# Patient Record
Sex: Female | Born: 1950 | Race: White | Hispanic: No | Marital: Single | Smoking: Never smoker
Health system: Southern US, Community
[De-identification: ages and names within clinical notes are randomized; demographics above are authoritative.]

## PROBLEM LIST (undated history)

## (undated) HISTORY — PX: ELBOW SURGERY: SHX618

## (undated) HISTORY — PX: HIP SURGERY: SHX245

---

## 1999-06-14 ENCOUNTER — Other Ambulatory Visit: Admission: RE | Admit: 1999-06-14 | Discharge: 1999-06-14 | Payer: Self-pay | Admitting: Obstetrics & Gynecology

## 2002-11-25 ENCOUNTER — Other Ambulatory Visit: Admission: RE | Admit: 2002-11-25 | Discharge: 2002-11-25 | Payer: Self-pay | Admitting: Obstetrics & Gynecology

## 2003-12-16 ENCOUNTER — Other Ambulatory Visit: Admission: RE | Admit: 2003-12-16 | Discharge: 2003-12-16 | Payer: Self-pay | Admitting: Obstetrics & Gynecology

## 2004-12-28 ENCOUNTER — Other Ambulatory Visit: Admission: RE | Admit: 2004-12-28 | Discharge: 2004-12-28 | Payer: Self-pay | Admitting: Obstetrics & Gynecology

## 2005-07-25 ENCOUNTER — Ambulatory Visit (HOSPITAL_COMMUNITY): Admission: RE | Admit: 2005-07-25 | Discharge: 2005-07-25 | Payer: Self-pay | Admitting: Gastroenterology

## 2005-10-24 ENCOUNTER — Emergency Department (HOSPITAL_COMMUNITY): Admission: EM | Admit: 2005-10-24 | Discharge: 2005-10-24 | Payer: Self-pay | Admitting: Emergency Medicine

## 2005-11-03 ENCOUNTER — Ambulatory Visit (HOSPITAL_BASED_OUTPATIENT_CLINIC_OR_DEPARTMENT_OTHER): Admission: RE | Admit: 2005-11-03 | Discharge: 2005-11-03 | Payer: Self-pay | Admitting: Orthopedic Surgery

## 2005-11-14 ENCOUNTER — Encounter: Admission: RE | Admit: 2005-11-14 | Discharge: 2006-02-12 | Payer: Self-pay | Admitting: Orthopedic Surgery

## 2007-02-16 ENCOUNTER — Encounter: Admission: RE | Admit: 2007-02-16 | Discharge: 2007-02-16 | Payer: Self-pay | Admitting: Obstetrics & Gynecology

## 2009-08-04 ENCOUNTER — Emergency Department (HOSPITAL_COMMUNITY): Admission: EM | Admit: 2009-08-04 | Discharge: 2009-08-04 | Payer: Self-pay | Admitting: Family Medicine

## 2010-05-27 ENCOUNTER — Ambulatory Visit: Payer: Self-pay | Admitting: Sports Medicine

## 2010-05-27 DIAGNOSIS — M79609 Pain in unspecified limb: Secondary | ICD-10-CM | POA: Insufficient documentation

## 2010-09-07 NOTE — Assessment & Plan Note (Signed)
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  Allergies (verified): No Known Drug Allergies  Physical Exam  General:  Well-developed,well-nourished,in no acute distress; alert,appropriate and cooperative throughout examination Msk:  Really not tender over PF insertion on RT has more tenderness over outer heel near plantar surface good ROM of toes mod high arch  left PF is nontender Additional Exam:  MSK [place] There is evidence of calcificaiton in PF insertion on RT some slt change of same on left no abnormal thickening of either PF though ON RT at lat margin of calcaneus there is a small bone spur with edema surrounding this   Impression & Recommendations:  Problem # 1:  HEEL PAIN (ICD-729.5)  Orders: Heel Cup (K4401) Sports Insoles (L3510) [place] LIMITED (02725)  RT side shows some spurring off lat calcaneus PF is not impressive  needs better shoe inserts we may want to get into custom orthotics w her walking stress  trial on sports insoles and heel cups  give std PF exrecises to prevent recurrence as I think she has had this in past based on scan  reck 1 mo   Orders Added: 1)  Heel Cup [L3649] 2)  Sports Insoles [L3510] 3)  New Patient Level II [99202] 4)  [place] LIMITED [36644]

## 2010-12-24 NOTE — Op Note (Signed)
NAMEHYACINTH, Kayla Cole               ACCOUNT NO.:  [id number]   MEDICAL RECORD NO.:  [id number]          PATIENT TYPE:  AMB   LOCATION:  ENDO                         FACILITY:  [facility]   PHYSICIAN:  Anselmo Rod, M.D.  DATE OF BIRTH:  09-14-1950   DATE OF PROCEDURE:  07/25/2005  DATE OF DISCHARGE:                                 OPERATIVE REPORT   PROCEDURE PERFORMED:  Screening colonoscopy.   ENDOSCOPIST:  Anselmo Rod, M.D.   INSTRUMENT USED:  Olympus video colonoscope.   INDICATIONS FOR PROCEDURE:  This is a 59 year old white female with a family  history of colon cancer in her maternal.  She is undergoing screening  colonoscopy to rule out colonic polyps, masses, etc.   PREPROCEDURE PREPARATION:  Informed consent was procured from the patient.  The patient fasted for four hour prior to the procedure and prepped with  OsmoPrep pills the night and in the morning of the procedure.  The risks and  benefits of the procedure including a 10% missed rate of cancer involved  were discussed with the patient as well.   PREPROCEDURE PHYSICAL EXAMINATION:  VITAL SIGNS:  The patient has stable  vital signs.  NECK:  The neck is supple.  CHEST:  The chest is clear to auscultation.  HEART:  Normal S1 and S2.  ABDOMEN:  The abdomen is soft with normal bowel sounds.   DESCRIPTION OF THE PROCEDURE:  The patient was placed in the left lateral  decubitus position.  She was sedated with a hundred micrograms of fentanyl  and 10 mg of Versed in slow incremental doses.  Once the patient was  adequately sedated, maintained on low flow oxygen and continuous cardiac  monitoring the Olympus video colonoscope was advanced in the rectum to the  cecum. The appendiceal orifice and ileocecal valve were clearly visualized  and photographed.  There were a few scattered  diverticula noted.  The  terminal ileum appeared healthy and without lesions.  Retroflexion in the  rectum revealed small nonbleeding  internal hemorrhoids. The patient  tolerated the procedure well without complications.   IMPRESSION:  1.Normal colonoscopy to the terminal ileum, except for a few  scattered diverticula.  2.Small internal hemorrhoids.   RECOMMENDATIONS:  1.Continue a high fiber diet with liberal fluid intake.  2.Repeat colonoscopy in the next five years unless the patient develops an  abnormal symptoms in the interim in which case she should contact the office  immediately.  3.Outpatient follow up as need arises in the future.      Anselmo Rod, M.D.  Electronically Signed     JNM/MEDQ  D:  07/25/2005  T:  07/27/2005  Job:  [id number]   cc:   Ilda Mori, M.D.  Fax: [phone]

## 2010-12-24 NOTE — Op Note (Signed)
NAMESHARLIZE, HOAR               ACCOUNT NO.:  [id number]   MEDICAL RECORD NO.:  [id number]          PATIENT TYPE:  AMB   LOCATION:  DSC                          FACILITY:  [facility]   PHYSICIAN:  Rodney A. Mortenson, M.D.DATE OF BIRTH:  [birth year]   DATE OF PROCEDURE:  11/03/2005  DATE OF DISCHARGE:                                 OPERATIVE REPORT   PREOPERATIVE DIAGNOSIS:  Comminuted fracture right olecranon.   POSTOPERATIVE DIAGNOSIS:  Comminuted fracture right olecranon.   OPERATION:  Open reduction and internal fixation using a nine-hole Acumed  Congruent olecranon plate, right elbow.   SURGEON:  Mortenson.   ASSISTANT:  Whitfield.   ANESTHESIA:  General.   PROCEDURE:  The patient was placed on the operating table in the supine  position.  After general anesthesia the right upper extremity was prepped  with DuraPrep and draped out in the usual manner.  A sterile tourniquet was  then placed on the arm.  The arm was wrapped out with an Esmarch and  tourniquet was elevated.  An incision was made over the posterior olecranon  and carried proximally down the ulna.  Skin edges were retracted.  Care was  taken to avoid any injury to the superficial cutaneous nerves.  Dissection  was carried down to the olecranon itself.  The fracture was clearly seen and  the fracture was markedly displaced.  Soft tissue was stripped off either  side of the proximal olecranon and good visualization was achieved.  With  the fracture site opened, there were loose pieces of bone in the elbow joint  and these were removed.  Some of the small pieces of bone had articular  cartilage and these were also removed.  The elbow was irrigated with saline  solution.  All loose material was removed.  With manipulation, the fracture  was brought back in a fairly anatomic position and held in position with  towel clip.  Crossed K wires were then applied and x-rays using a C-arm was  used throughout.  An excellent  reduction was achieved.  The Acumed nine-hold  olecranon plate was then selected and placed over the olecranon and distally  onto the ulna.  Drill hole was placed posteriorly and a long 50-mm screw was  placed through the olecranon and down the canal of the ulna.  As the  fracture was compressed, drill holes were then placed in the plate distally  beyond the fracture site.  A fair amount of time was spent doing this part  of the procedure.  An excellent very stable construct was obtained.  By  radiograph criteria, the curvature of the olecranon and the articular  surfaces appeared almost anatomic.  There was a defect, however, at the  fracture site with loss of some articular cartilage.  With flexion and  extension, there was no crepitus or other abnormality felt about the elbow.  There was a large fragment of bone on the lateral side of the olecranon that  was held back in anatomic position with a small screw placed through this  fragment and closed the comminuted fracture laterally.  Again the elbow was  irrigated with a copious amount of saline solution as the skin was flaky and  dry.  An excellent construction of the fracture was achieved.  I was very  pleased with this.  A 2-0 Vicryl was then used to close the fascial layers  and the subcutaneous tissue and stainless steel staples were used to close  the skin.  Marcaine was placed about the wound.  Bulky sterile dressing was  applied and a posterior plaster splint with the elbow flexed at about 80  degrees was done.  __________ procedure went extremely well.   DRAINS:  None.   COMPLICATIONS:  None.           ______________________________  Lenard Galloway. Chaney Malling, M.D.     RAM/MEDQ  D:  11/03/2005  T:  11/04/2005  Job:  [id number]

## 2012-05-29 ENCOUNTER — Ambulatory Visit (INDEPENDENT_AMBULATORY_CARE_PROVIDER_SITE_OTHER): Payer: Self-pay | Admitting: Family Medicine

## 2012-05-29 DIAGNOSIS — Z713 Dietary counseling and surveillance: Secondary | ICD-10-CM

## 2015-04-17 ENCOUNTER — Emergency Department (HOSPITAL_COMMUNITY)
Admission: EM | Admit: 2015-04-17 | Discharge: 2015-04-17 | Disposition: A | Discharge status: Home / Self Care | Payer: 59 | Source: Home / Self Care | Attending: Family Medicine | Admitting: Family Medicine

## 2015-04-17 ENCOUNTER — Encounter (HOSPITAL_COMMUNITY): Payer: Self-pay | Admitting: *Deleted

## 2015-04-17 DIAGNOSIS — B029 Zoster without complications: Secondary | ICD-10-CM

## 2015-04-17 MED ORDER — VALACYCLOVIR HCL 1 G PO TABS: 1000.0000 mg | ORAL_TABLET | Freq: Three times a day (TID) | ORAL | Status: AC

## 2015-04-17 MED ORDER — HYDROCODONE-ACETAMINOPHEN 5-325 MG PO TABS: 1.0000 | ORAL_TABLET | ORAL | Status: AC | PRN

## 2015-04-17 MED ORDER — LIDOCAINE 5 % EX PTCH: 1.0000 | MEDICATED_PATCH | CUTANEOUS | Status: AC

## 2015-04-17 NOTE — ED Provider Notes (Signed)
CSN: [id number]     Arrival date & time 04/17/15  1719 History   First MD Initiated Contact with Patient 04/17/15 1828     Chief Complaint  Patient presents with  . Rash   (Consider location/radiation/quality/duration/timing/severity/associated sxs/prior Treatment) HPI Comments: 64 year old female complaining of painful rash that began approximately one week ago. He developed as a couple of small tender papules that over the past 3 days it has increased in size substantially as well has the associated pain described as burning, stinging and aching. It is located along the left hip from the mid clavicular line to the right mid scapular line. There is another line of erythema and vesicles beginning at the anterior axillary line to the spine. The rashes are tender. Hurts with movement. Denies fever, chills or other systemic symptoms. In addition she was having low back pain and left hip pain a couple weeks ago and saw an orthopedist who treated her for trochanteric bursitis with an injection of steroids as well as by mouth steroids.   History reviewed. No pertinent past medical history. Past Surgical History  Procedure Laterality Date  . Elbow surgery    . Hip surgery     History reviewed. No pertinent family history. Social History  Substance Use Topics  . Smoking status: Never Smoker   . Smokeless tobacco: None  . Alcohol Use: Yes   OB History    No data available     Review of Systems  Constitutional: Positive for activity change. Negative for fever and fatigue.  HENT: Negative.   Respiratory: Negative.   Gastrointestinal: Negative.   Genitourinary: Negative.   Musculoskeletal: Positive for back pain and gait problem.  Skin: Positive for color change and rash.  Neurological: Negative for dizziness and headaches.  Psychiatric/Behavioral: Negative.     Allergies  Review of patient's allergies indicates no known allergies.  Home Medications   Prior to Admission medications    Medication Sig Start Date End Date Taking? Authorizing Provider  Methocarbamol (ROBAXIN PO) Take by mouth.   Yes Historical Provider, MD  HYDROcodone-acetaminophen (NORCO/VICODIN) 5-325 MG per tablet Take 1 tablet by mouth every 4 (four) hours as needed. 04/17/15   Janne Napoleon, NP  lidocaine (LIDODERM) 5 % Place 1 patch onto the skin daily. Remove & Discard patch within 12 hours or as directed by MD 04/17/15   Janne Napoleon, NP  valACYclovir (VALTREX) 1000 MG tablet Take 1 tablet (1,000 mg total) by mouth 3 (three) times daily. 04/17/15   Janne Napoleon, NP   Meds Ordered and Administered this Visit  Medications - No data to display  BP 148/97 mmHg  Pulse 84  Temp(Src) 98.3 F (36.8 C) (Oral)  Resp 16  SpO2 98% No data found.   Physical Exam  Constitutional: She is oriented to person, place, and time. She appears well-developed and well-nourished. No distress.  HENT:  Face and eyes are spared from the zoster lesions  Eyes: EOM are normal.  Neck: Normal range of motion. Neck supple.  Cardiovascular: Normal rate.   Pulmonary/Chest: Effort normal. No respiratory distress.  Musculoskeletal: Normal range of motion. She exhibits tenderness. She exhibits no edema.  Tender L hip  Neurological: She is alert and oriented to person, place, and time. She exhibits normal muscle tone.  Skin: Skin is warm and dry. Rash noted.  Dermatomes T8, T10 and T11 to be affected with a papulovesicular rash with underlying deep erythema. Marked tenderness. No sign of bacterial infection.  Psychiatric: She has a  normal mood and affect.  Nursing note and vitals reviewed.   ED Course  Procedures (including critical care time)  Labs Review Labs Reviewed - No data to display  Imaging Review No results found.   Visual Acuity Review  Right Eye Distance:   Left Eye Distance:   Bilateral Distance:    Right Eye Near:   Left Eye Near:    Bilateral Near:         MDM   1. Herpes zoster    Valtrex 1 gm  tid norco 5 mg #15 lidoderm patches as dir    Janne Napoleon, NP 04/17/15 [phone]

## 2015-04-17 NOTE — Discharge Instructions (Signed)
Shingles Shingles (herpes zoster) is an infection that is caused by the same virus that causes chickenpox (varicella). The infection causes a painful skin rash and fluid-filled blisters, which eventually break open, crust over, and heal. It may occur in any area of the body, but it usually affects only one side of the body or face. The pain of shingles usually lasts about 1 month. However, some people with shingles may develop long-term (chronic) pain in the affected area of the body. Shingles often occurs many years after the person had chickenpox. It is more common:  In people older than 50 years.  In people with weakened immune systems, such as those with HIV, AIDS, or cancer.  In people taking medicines that weaken the immune system, such as transplant medicines.  In people under great stress. CAUSES  Shingles is caused by the varicella zoster virus (VZV), which also causes chickenpox. After a person is infected with the virus, it can remain in the person's body for years in an inactive state (dormant). To cause shingles, the virus reactivates and breaks out as an infection in a nerve root. The virus can be spread from person to person (contagious) through contact with open blisters of the shingles rash. It will only spread to people who have not had chickenpox. When these people are exposed to the virus, they may develop chickenpox. They will not develop shingles. Once the blisters scab over, the person is no longer contagious and cannot spread the virus to others. SIGNS AND SYMPTOMS  Shingles shows up in stages. The initial symptoms may be pain, itching, and tingling in an area of the skin. This pain is usually described as burning, stabbing, or throbbing.In a few days or weeks, a painful red rash will appear in the area where the pain, itching, and tingling were felt. The rash is usually on one side of the body in a band or belt-like pattern. Then, the rash usually turns into fluid-filled  blisters. They will scab over and dry up in approximately 2-3 weeks. Flu-like symptoms may also occur with the initial symptoms, the rash, or the blisters. These may include:  Fever.  Chills.  Headache.  Upset stomach. DIAGNOSIS  Your health care provider will perform a skin exam to diagnose shingles. Skin scrapings or fluid samples may also be taken from the blisters. This sample will be examined under a microscope or sent to a lab for further testing. TREATMENT  There is no specific cure for shingles. Your health care provider will likely prescribe medicines to help you manage the pain, recover faster, and avoid long-term problems. This may include antiviral drugs, anti-inflammatory drugs, and pain medicines. HOME CARE INSTRUCTIONS   Take a cool bath or apply cool compresses to the area of the rash or blisters as directed. This may help with the pain and itching.   Take medicines only as directed by your health care provider.   Rest as directed by your health care provider.  Keep your rash and blisters clean with mild soap and cool water or as directed by your health care provider.  Do not pick your blisters or scratch your rash. Apply an anti-itch cream or numbing creams to the affected area as directed by your health care provider.  Keep your shingles rash covered with a loose bandage (dressing).  Avoid skin contact with:  Babies.   Pregnant women.   Children with eczema.   Elderly people with transplants.   People with chronic illnesses, such as leukemia  or AIDS.   °· Wear loose-fitting clothing to help ease the pain of material rubbing against the rash. °· Keep all follow-up visits as directed by your health care provider. If the area involved is on your face, you may receive a referral for a specialist, such as an eye doctor (ophthalmologist) or an ear, nose, and throat (ENT) doctor. Keeping all follow-up visits will help you avoid eye problems, chronic pain, or  disability.   °SEEK IMMEDIATE MEDICAL CARE IF:  °· You have facial pain, pain around the eye area, or loss of feeling on one side of your face. °· You have ear pain or ringing in your ear. °· You have loss of taste. °· Your pain is not relieved with prescribed medicines.   °· Your redness or swelling spreads.   °· You have more pain and swelling.  °· Your condition is worsening or has changed.   °· You have a fever. °MAKE SURE YOU: °· Understand these instructions. °· Will watch your condition. °· Will get help right away if you are not doing well or get worse. °Document Released: 07/25/2005 Document Revised: 12/09/2013 Document Reviewed: 03/08/2012 °ExitCare® Patient Information ©2015 [employer]. This information is not intended to replace advice given to you by your health care provider. Make sure you discuss any questions you have with your health care provider. ° °Neuropathic Pain °We often think that pain has a physical cause. If we get rid of the cause, the pain should go away. Nerves themselves can also cause pain. It is called neuropathic pain, which means nerve abnormality. It may be difficult for the patients who have it and for the treating caregivers. Pain is usually described as acute (short-lived) or chronic (long-lasting). Acute pain is related to the physical sensations caused by an injury. It can last from a few seconds to many weeks, but it usually goes away when normal healing occurs. Chronic pain lasts beyond the typical healing time. With neuropathic pain, the nerve fibers themselves may be damaged or injured. They then send incorrect signals to other pain centers. The pain you feel is real, but the cause is not easy to find.  °CAUSES  °Chronic pain can result from diseases, such as diabetes and shingles (an infection related to chickenpox), or from trauma, surgery, or amputation. It can also happen without any known injury or disease. The nerves are sending pain messages, even though there  is no identifiable cause for such messages.  °· Other common causes of neuropathy include diabetes, phantom limb pain, or Regional Pain Syndrome (RPS). °· As with all forms of chronic back pain, if neuropathy is not correctly treated, there can be a number of associated problems that lead to a downward cycle for the patient. These include depression, sleeplessness, feelings of fear and anxiety, limited social interaction and inability to do normal daily activities or work. °· The most dramatic and mysterious example of neuropathic pain is called "phantom limb syndrome." This occurs when an arm or a leg has been removed because of illness or injury. The brain still gets pain messages from the nerves that originally carried impulses from the missing limb. These nerves now seem to misfire and cause troubling pain. °· Neuropathic pain often seems to have no cause. It responds poorly to standard pain treatment. °Neuropathic pain can occur after: °· Shingles (herpes zoster virus infection). °· A lasting burning sensation of the skin, caused usually by injury to a peripheral nerve. °· Peripheral neuropathy which is widespread nerve damage, often caused by diabetes or   alcoholism. °· Phantom limb pain following an amputation. °· Facial nerve problems (trigeminal neuralgia). °· Multiple sclerosis. °· Reflex sympathetic dystrophy. °· Pain which comes with cancer and cancer chemotherapy. °· Entrapment neuropathy such as when pressure is put on a nerve such as in carpal tunnel syndrome. °· Back, leg, and hip problems (sciatica). °· Spine or back surgery. °· HIV Infection or AIDS where nerves are infected by viruses. °Your caregiver can explain items in the above list which may apply to you. °SYMPTOMS  °Characteristics of neuropathic pain are: °· Severe, sharp, electric shock-like, shooting, lightening-like, knife-like. °· Pins and needles sensation. °· Deep burning, deep cold, or deep ache. °· Persistent numbness, tingling, or  weakness. °· Pain resulting from light touch or other stimulus that would not usually cause pain. °· Increased sensitivity to something that would normally cause pain, such as a pinprick. °Pain may persist for months or years following the healing of damaged tissues. When this happens, pain signals no longer sound an alarm about current injuries or injuries about to happen. Instead, the alarm system itself is not working correctly.  °Neuropathic pain may get worse instead of better over time. For some people, it can lead to serious disability. It is important to be aware that severe injury in a limb can occur without a proper, protective pain response. Burns, cuts, and other injuries may go unnoticed. Without proper treatment, these injuries can become infected or lead to further disability. Take any injury seriously, and consult your caregiver for treatment. °DIAGNOSIS  °When you have a pain with no known cause, your caregiver will probably ask some specific questions:  °· Do you have any other conditions, such as diabetes, shingles, multiple sclerosis, or HIV infection? °· How would you describe your pain? (Neuropathic pain is often described as shooting, stabbing, burning, or searing.) °· Is your pain worse at any time of the day? (Neuropathic pain is usually worse at night.) °· Does the pain seem to follow a certain physical pathway? °· Does the pain come from an area that has missing or injured nerves? (An example would be phantom limb pain.) °· Is the pain triggered by minor things such as rubbing against the sheets at night? °These questions often help define the type of pain involved. Once your caregiver knows what is happening, treatment can begin. Anticonvulsant, antidepressant drugs, and various pain relievers seem to work in some cases. If another condition, such as diabetes is involved, better management of that disorder may relieve the neuropathic pain.  °TREATMENT  °Neuropathic pain is frequently  long-lasting and tends not to respond to treatment with narcotic type pain medication. It may respond well to other drugs such as antiseizure and antidepressant medications. Usually, neuropathic problems do not completely go away, but partial improvement is often possible with proper treatment. Your caregivers have large numbers of medications available to treat you. Do not be discouraged if you do not get immediate relief. Sometimes different medications or a combination of medications will be tried before you receive the results you are hoping for. See your caregiver if you have pain that seems to be coming from nowhere and does not go away. Help is available.  °SEEK IMMEDIATE MEDICAL CARE IF:  °· There is a sudden change in the quality of your pain, especially if the change is on only one side of the body. °· You notice changes of the skin, such as redness, black or purple discoloration, swelling, or an ulcer. °· You cannot move the affected   limbs. °Document Released: 04/21/2004 Document Revised: 10/17/2011 Document Reviewed: 04/21/2004 °ExitCare® Patient Information ©2015 [employer]. This information is not intended to replace advice given to you by your health care provider. Make sure you discuss any questions you have with your health care provider. ° °

## 2015-04-17 NOTE — ED Notes (Signed)
Pt  Has  A  Rash  On  The  l   Hip   Area       X  3-4  Days  -      Seen  For     bursistis   6     Days      Ago          And  Was  RX    Prednisone  /  Robaxin          The  reash  Burns    And    radaites  Around  flank

## 2016-04-28 DIAGNOSIS — H524 Presbyopia: Secondary | ICD-10-CM | POA: Diagnosis not present

## 2016-04-28 DIAGNOSIS — H04123 Dry eye syndrome of bilateral lacrimal glands: Secondary | ICD-10-CM | POA: Diagnosis not present

## 2016-11-23 DIAGNOSIS — Z01419 Encounter for gynecological examination (general) (routine) without abnormal findings: Secondary | ICD-10-CM | POA: Diagnosis not present

## 2016-11-23 DIAGNOSIS — Z1231 Encounter for screening mammogram for malignant neoplasm of breast: Secondary | ICD-10-CM | POA: Diagnosis not present

## 2016-11-25 MED FILL — PHENTERMINE 37.5 MG TABLET: 37.5 | 30 days supply | Qty: 30 | Fill #0

## 2017-05-01 DIAGNOSIS — H04123 Dry eye syndrome of bilateral lacrimal glands: Secondary | ICD-10-CM | POA: Diagnosis not present

## 2017-05-01 DIAGNOSIS — H524 Presbyopia: Secondary | ICD-10-CM | POA: Diagnosis not present

## 2018-03-06 DIAGNOSIS — R69 Illness, unspecified: Secondary | ICD-10-CM | POA: Diagnosis not present

## 2018-06-11 DIAGNOSIS — R69 Illness, unspecified: Secondary | ICD-10-CM | POA: Diagnosis not present

## 2018-10-02 DIAGNOSIS — R69 Illness, unspecified: Secondary | ICD-10-CM | POA: Diagnosis not present

## 2019-04-04 DIAGNOSIS — R69 Illness, unspecified: Secondary | ICD-10-CM | POA: Diagnosis not present

## 2019-04-22 DIAGNOSIS — H04123 Dry eye syndrome of bilateral lacrimal glands: Secondary | ICD-10-CM | POA: Diagnosis not present

## 2019-04-22 DIAGNOSIS — H43812 Vitreous degeneration, left eye: Secondary | ICD-10-CM | POA: Diagnosis not present

## 2019-04-22 DIAGNOSIS — H353121 Nonexudative age-related macular degeneration, left eye, early dry stage: Secondary | ICD-10-CM | POA: Diagnosis not present

## 2019-05-15 DIAGNOSIS — Z131 Encounter for screening for diabetes mellitus: Secondary | ICD-10-CM | POA: Diagnosis not present

## 2019-05-15 DIAGNOSIS — Z86010 Personal history of colonic polyps: Secondary | ICD-10-CM | POA: Diagnosis not present

## 2019-05-15 DIAGNOSIS — Z0000 Encounter for general adult medical examination without abnormal findings: Secondary | ICD-10-CM | POA: Diagnosis not present

## 2019-05-15 DIAGNOSIS — E663 Overweight: Secondary | ICD-10-CM | POA: Diagnosis not present

## 2019-05-15 DIAGNOSIS — Z13220 Encounter for screening for lipoid disorders: Secondary | ICD-10-CM | POA: Diagnosis not present

## 2019-05-30 DIAGNOSIS — H43812 Vitreous degeneration, left eye: Secondary | ICD-10-CM | POA: Diagnosis not present

## 2019-05-30 DIAGNOSIS — H353121 Nonexudative age-related macular degeneration, left eye, early dry stage: Secondary | ICD-10-CM | POA: Diagnosis not present

## 2019-05-30 DIAGNOSIS — H04123 Dry eye syndrome of bilateral lacrimal glands: Secondary | ICD-10-CM | POA: Diagnosis not present

## 2019-05-31 DIAGNOSIS — H43813 Vitreous degeneration, bilateral: Secondary | ICD-10-CM | POA: Diagnosis not present

## 2019-05-31 DIAGNOSIS — H35362 Drusen (degenerative) of macula, left eye: Secondary | ICD-10-CM | POA: Diagnosis not present

## 2019-05-31 DIAGNOSIS — H35033 Hypertensive retinopathy, bilateral: Secondary | ICD-10-CM | POA: Diagnosis not present

## 2019-05-31 DIAGNOSIS — H31091 Other chorioretinal scars, right eye: Secondary | ICD-10-CM | POA: Diagnosis not present

## 2019-06-12 DIAGNOSIS — R69 Illness, unspecified: Secondary | ICD-10-CM | POA: Diagnosis not present

## 2019-06-21 DIAGNOSIS — Z0000 Encounter for general adult medical examination without abnormal findings: Secondary | ICD-10-CM | POA: Diagnosis not present

## 2019-06-21 DIAGNOSIS — Z86010 Personal history of colonic polyps: Secondary | ICD-10-CM | POA: Diagnosis not present

## 2019-06-21 DIAGNOSIS — Z131 Encounter for screening for diabetes mellitus: Secondary | ICD-10-CM | POA: Diagnosis not present

## 2019-06-21 DIAGNOSIS — Z13220 Encounter for screening for lipoid disorders: Secondary | ICD-10-CM | POA: Diagnosis not present

## 2019-06-21 DIAGNOSIS — E663 Overweight: Secondary | ICD-10-CM | POA: Diagnosis not present

## 2019-07-15 DIAGNOSIS — H31091 Other chorioretinal scars, right eye: Secondary | ICD-10-CM | POA: Diagnosis not present

## 2019-07-15 DIAGNOSIS — H35033 Hypertensive retinopathy, bilateral: Secondary | ICD-10-CM | POA: Diagnosis not present

## 2019-07-15 DIAGNOSIS — H35362 Drusen (degenerative) of macula, left eye: Secondary | ICD-10-CM | POA: Diagnosis not present

## 2019-07-15 DIAGNOSIS — H43813 Vitreous degeneration, bilateral: Secondary | ICD-10-CM | POA: Diagnosis not present

## 2019-09-23 DIAGNOSIS — H43813 Vitreous degeneration, bilateral: Secondary | ICD-10-CM | POA: Diagnosis not present

## 2019-09-23 DIAGNOSIS — H35033 Hypertensive retinopathy, bilateral: Secondary | ICD-10-CM | POA: Diagnosis not present

## 2019-09-23 DIAGNOSIS — H35362 Drusen (degenerative) of macula, left eye: Secondary | ICD-10-CM | POA: Diagnosis not present

## 2019-09-23 DIAGNOSIS — H31091 Other chorioretinal scars, right eye: Secondary | ICD-10-CM | POA: Diagnosis not present

## 2019-10-21 DIAGNOSIS — R69 Illness, unspecified: Secondary | ICD-10-CM | POA: Diagnosis not present

## 2019-12-10 DIAGNOSIS — R69 Illness, unspecified: Secondary | ICD-10-CM | POA: Diagnosis not present

## 2019-12-31 DIAGNOSIS — Z6824 Body mass index (BMI) 24.0-24.9, adult: Secondary | ICD-10-CM | POA: Diagnosis not present

## 2019-12-31 DIAGNOSIS — Z1231 Encounter for screening mammogram for malignant neoplasm of breast: Secondary | ICD-10-CM | POA: Diagnosis not present

## 2019-12-31 DIAGNOSIS — Z01419 Encounter for gynecological examination (general) (routine) without abnormal findings: Secondary | ICD-10-CM | POA: Diagnosis not present

## 2020-01-02 ENCOUNTER — Other Ambulatory Visit: Payer: Self-pay | Admitting: Obstetrics & Gynecology

## 2020-01-02 DIAGNOSIS — R928 Other abnormal and inconclusive findings on diagnostic imaging of breast: Secondary | ICD-10-CM

## 2020-01-14 ENCOUNTER — Other Ambulatory Visit: Payer: Self-pay

## 2020-01-14 ENCOUNTER — Ambulatory Visit
Admission: RE | Admit: 2020-01-14 | Discharge: 2020-01-14 | Disposition: A | Discharge status: Home / Self Care | Payer: Medicare HMO | Source: Ambulatory Visit | Attending: Obstetrics & Gynecology | Admitting: Obstetrics & Gynecology

## 2020-01-14 ENCOUNTER — Ambulatory Visit
Admission: RE | Admit: 2020-01-14 | Discharge: 2020-01-14 | Disposition: A | Discharge status: Home / Self Care | Payer: 59 | Source: Ambulatory Visit | Attending: Obstetrics & Gynecology | Admitting: Obstetrics & Gynecology

## 2020-01-14 ENCOUNTER — Other Ambulatory Visit: Payer: Self-pay | Admitting: Obstetrics & Gynecology

## 2020-01-14 DIAGNOSIS — N6489 Other specified disorders of breast: Secondary | ICD-10-CM

## 2020-01-14 DIAGNOSIS — R922 Inconclusive mammogram: Secondary | ICD-10-CM | POA: Diagnosis not present

## 2020-01-14 DIAGNOSIS — R928 Other abnormal and inconclusive findings on diagnostic imaging of breast: Secondary | ICD-10-CM

## 2020-01-14 DIAGNOSIS — N6001 Solitary cyst of right breast: Secondary | ICD-10-CM | POA: Diagnosis not present

## 2020-01-21 ENCOUNTER — Ambulatory Visit
Admission: RE | Admit: 2020-01-21 | Discharge: 2020-01-21 | Disposition: A | Discharge status: Home / Self Care | Payer: 59 | Source: Ambulatory Visit | Attending: Obstetrics & Gynecology | Admitting: Obstetrics & Gynecology

## 2020-01-21 ENCOUNTER — Ambulatory Visit
Admission: RE | Admit: 2020-01-21 | Discharge: 2020-01-21 | Disposition: A | Discharge status: Home / Self Care | Payer: Medicare HMO | Source: Ambulatory Visit | Attending: Obstetrics & Gynecology | Admitting: Obstetrics & Gynecology

## 2020-01-21 ENCOUNTER — Other Ambulatory Visit: Payer: Self-pay

## 2020-01-21 DIAGNOSIS — N62 Hypertrophy of breast: Secondary | ICD-10-CM | POA: Diagnosis not present

## 2020-01-21 DIAGNOSIS — R921 Mammographic calcification found on diagnostic imaging of breast: Secondary | ICD-10-CM | POA: Diagnosis not present

## 2020-01-21 DIAGNOSIS — N6489 Other specified disorders of breast: Secondary | ICD-10-CM

## 2020-01-21 HISTORY — PX: BREAST BIOPSY: SHX20

## 2020-05-25 DIAGNOSIS — Z1389 Encounter for screening for other disorder: Secondary | ICD-10-CM | POA: Diagnosis not present

## 2020-05-25 DIAGNOSIS — Z0000 Encounter for general adult medical examination without abnormal findings: Secondary | ICD-10-CM | POA: Diagnosis not present

## 2020-06-01 DIAGNOSIS — R69 Illness, unspecified: Secondary | ICD-10-CM | POA: Diagnosis not present

## 2020-06-04 DIAGNOSIS — H5211 Myopia, right eye: Secondary | ICD-10-CM | POA: Diagnosis not present

## 2020-06-04 DIAGNOSIS — H524 Presbyopia: Secondary | ICD-10-CM | POA: Diagnosis not present

## 2020-06-04 DIAGNOSIS — H52221 Regular astigmatism, right eye: Secondary | ICD-10-CM | POA: Diagnosis not present

## 2020-06-08 ENCOUNTER — Other Ambulatory Visit: Payer: Self-pay | Admitting: Family Medicine

## 2020-06-08 DIAGNOSIS — E2839 Other primary ovarian failure: Secondary | ICD-10-CM

## 2020-06-08 DIAGNOSIS — Z13820 Encounter for screening for osteoporosis: Secondary | ICD-10-CM

## 2020-07-24 ENCOUNTER — Other Ambulatory Visit: Payer: Self-pay | Admitting: Obstetrics & Gynecology

## 2020-07-24 DIAGNOSIS — R928 Other abnormal and inconclusive findings on diagnostic imaging of breast: Secondary | ICD-10-CM

## 2020-09-03 ENCOUNTER — Other Ambulatory Visit: Payer: 59

## 2020-09-04 ENCOUNTER — Ambulatory Visit
Admission: RE | Admit: 2020-09-04 | Discharge: 2020-09-04 | Disposition: A | Discharge status: Home / Self Care | Payer: Medicare HMO | Source: Ambulatory Visit | Attending: Obstetrics & Gynecology | Admitting: Obstetrics & Gynecology

## 2020-09-04 ENCOUNTER — Ambulatory Visit: Payer: 59

## 2020-09-04 ENCOUNTER — Other Ambulatory Visit: Payer: Self-pay

## 2020-09-04 DIAGNOSIS — R928 Other abnormal and inconclusive findings on diagnostic imaging of breast: Secondary | ICD-10-CM

## 2020-09-04 DIAGNOSIS — R922 Inconclusive mammogram: Secondary | ICD-10-CM | POA: Diagnosis not present

## 2020-09-07 ENCOUNTER — Other Ambulatory Visit: Payer: Self-pay

## 2020-09-07 ENCOUNTER — Ambulatory Visit
Admission: RE | Admit: 2020-09-07 | Discharge: 2020-09-07 | Disposition: A | Discharge status: Home / Self Care | Payer: 59 | Source: Ambulatory Visit | Attending: Family Medicine | Admitting: Family Medicine

## 2020-09-07 DIAGNOSIS — M8589 Other specified disorders of bone density and structure, multiple sites: Secondary | ICD-10-CM | POA: Diagnosis not present

## 2020-09-07 DIAGNOSIS — E2839 Other primary ovarian failure: Secondary | ICD-10-CM

## 2020-09-07 DIAGNOSIS — Z13820 Encounter for screening for osteoporosis: Secondary | ICD-10-CM

## 2020-09-07 DIAGNOSIS — Z780 Asymptomatic menopausal state: Secondary | ICD-10-CM | POA: Diagnosis not present

## 2020-09-25 DIAGNOSIS — Z131 Encounter for screening for diabetes mellitus: Secondary | ICD-10-CM | POA: Diagnosis not present

## 2020-09-25 DIAGNOSIS — R69 Illness, unspecified: Secondary | ICD-10-CM | POA: Diagnosis not present

## 2020-09-25 DIAGNOSIS — E2839 Other primary ovarian failure: Secondary | ICD-10-CM | POA: Diagnosis not present

## 2020-10-12 DIAGNOSIS — H35033 Hypertensive retinopathy, bilateral: Secondary | ICD-10-CM | POA: Diagnosis not present

## 2020-10-12 DIAGNOSIS — H43813 Vitreous degeneration, bilateral: Secondary | ICD-10-CM | POA: Diagnosis not present

## 2020-10-12 DIAGNOSIS — H35362 Drusen (degenerative) of macula, left eye: Secondary | ICD-10-CM | POA: Diagnosis not present

## 2020-10-12 DIAGNOSIS — H31091 Other chorioretinal scars, right eye: Secondary | ICD-10-CM | POA: Diagnosis not present

## 2020-11-04 DIAGNOSIS — Z131 Encounter for screening for diabetes mellitus: Secondary | ICD-10-CM | POA: Diagnosis not present

## 2020-11-04 DIAGNOSIS — R69 Illness, unspecified: Secondary | ICD-10-CM | POA: Diagnosis not present

## 2020-11-04 DIAGNOSIS — E2839 Other primary ovarian failure: Secondary | ICD-10-CM | POA: Diagnosis not present

## 2020-11-04 DIAGNOSIS — Z136 Encounter for screening for cardiovascular disorders: Secondary | ICD-10-CM | POA: Diagnosis not present

## 2021-01-18 DIAGNOSIS — D485 Neoplasm of uncertain behavior of skin: Secondary | ICD-10-CM | POA: Diagnosis not present

## 2021-01-18 DIAGNOSIS — L720 Epidermal cyst: Secondary | ICD-10-CM | POA: Diagnosis not present

## 2021-01-18 DIAGNOSIS — D1721 Benign lipomatous neoplasm of skin and subcutaneous tissue of right arm: Secondary | ICD-10-CM | POA: Diagnosis not present

## 2021-01-18 DIAGNOSIS — L821 Other seborrheic keratosis: Secondary | ICD-10-CM | POA: Diagnosis not present

## 2021-01-18 DIAGNOSIS — D225 Melanocytic nevi of trunk: Secondary | ICD-10-CM | POA: Diagnosis not present

## 2021-01-18 DIAGNOSIS — L819 Disorder of pigmentation, unspecified: Secondary | ICD-10-CM | POA: Diagnosis not present

## 2021-01-18 DIAGNOSIS — D1801 Hemangioma of skin and subcutaneous tissue: Secondary | ICD-10-CM | POA: Diagnosis not present

## 2021-01-18 DIAGNOSIS — C44319 Basal cell carcinoma of skin of other parts of face: Secondary | ICD-10-CM | POA: Diagnosis not present

## 2021-01-18 DIAGNOSIS — C44311 Basal cell carcinoma of skin of nose: Secondary | ICD-10-CM | POA: Diagnosis not present

## 2021-01-18 DIAGNOSIS — D1722 Benign lipomatous neoplasm of skin and subcutaneous tissue of left arm: Secondary | ICD-10-CM | POA: Diagnosis not present

## 2021-03-29 DIAGNOSIS — C44319 Basal cell carcinoma of skin of other parts of face: Secondary | ICD-10-CM | POA: Diagnosis not present

## 2021-04-05 DIAGNOSIS — C44319 Basal cell carcinoma of skin of other parts of face: Secondary | ICD-10-CM | POA: Diagnosis not present

## 2021-04-05 DIAGNOSIS — C4401 Basal cell carcinoma of skin of lip: Secondary | ICD-10-CM | POA: Diagnosis not present

## 2021-04-14 DIAGNOSIS — Z1231 Encounter for screening mammogram for malignant neoplasm of breast: Secondary | ICD-10-CM | POA: Diagnosis not present

## 2021-05-31 DIAGNOSIS — Z0000 Encounter for general adult medical examination without abnormal findings: Secondary | ICD-10-CM | POA: Diagnosis not present

## 2021-05-31 DIAGNOSIS — Z1389 Encounter for screening for other disorder: Secondary | ICD-10-CM | POA: Diagnosis not present

## 2021-06-07 DIAGNOSIS — H52222 Regular astigmatism, left eye: Secondary | ICD-10-CM | POA: Diagnosis not present

## 2021-07-09 ENCOUNTER — Ambulatory Visit: Payer: Medicare HMO

## 2021-07-13 DIAGNOSIS — Z131 Encounter for screening for diabetes mellitus: Secondary | ICD-10-CM | POA: Diagnosis not present

## 2021-07-13 DIAGNOSIS — E7800 Pure hypercholesterolemia, unspecified: Secondary | ICD-10-CM | POA: Diagnosis not present

## 2021-07-13 DIAGNOSIS — E2839 Other primary ovarian failure: Secondary | ICD-10-CM | POA: Diagnosis not present

## 2021-07-13 DIAGNOSIS — R413 Other amnesia: Secondary | ICD-10-CM | POA: Diagnosis not present

## 2021-09-07 DIAGNOSIS — R7309 Other abnormal glucose: Secondary | ICD-10-CM

## 2021-09-07 LAB — GLUCOSE, POCT (MANUAL RESULT ENTRY): POC Glucose: 87 mg/dl (ref 70–99)

## 2021-09-07 NOTE — Congregational Nurse Program (Signed)
°  Dept: [phone]   Congregational Nurse Program Note  Date of Encounter: 09/07/2021  Past Medical History: No past medical history on file.  Encounter Details:  CNP Questionnaire - 09/07/21 1313       Questionnaire   Do you give verbal consent to treat you today? Yes    Location Patient [place] or Organization    Patient Status Unknown    Sport and exercise psychologist or [employer] Referral N/A    Medication N/A    Medical Provider Yes    Screening Referrals N/A    Medical Referral N/A    Medical Appointment Made N/A    Food N/A    Transportation N/A    Housing/Utilities N/A    Interpersonal Safety N/A    Intervention Blood glucose    ED Visit Averted Yes    Life-Saving Intervention Made N/A            Patient in for blood glucose check.  Toula Moos, CRNA Congregational Nurse

## 2021-10-12 DIAGNOSIS — L570 Actinic keratosis: Secondary | ICD-10-CM | POA: Diagnosis not present

## 2021-10-12 DIAGNOSIS — D485 Neoplasm of uncertain behavior of skin: Secondary | ICD-10-CM | POA: Diagnosis not present

## 2021-10-12 DIAGNOSIS — D2272 Melanocytic nevi of left lower limb, including hip: Secondary | ICD-10-CM | POA: Diagnosis not present

## 2021-10-12 DIAGNOSIS — L812 Freckles: Secondary | ICD-10-CM | POA: Diagnosis not present

## 2021-10-12 DIAGNOSIS — D1801 Hemangioma of skin and subcutaneous tissue: Secondary | ICD-10-CM | POA: Diagnosis not present

## 2021-10-12 DIAGNOSIS — L821 Other seborrheic keratosis: Secondary | ICD-10-CM | POA: Diagnosis not present

## 2021-10-12 DIAGNOSIS — Z85828 Personal history of other malignant neoplasm of skin: Secondary | ICD-10-CM | POA: Diagnosis not present

## 2021-10-12 DIAGNOSIS — D1722 Benign lipomatous neoplasm of skin and subcutaneous tissue of left arm: Secondary | ICD-10-CM | POA: Diagnosis not present

## 2021-10-12 DIAGNOSIS — L738 Other specified follicular disorders: Secondary | ICD-10-CM | POA: Diagnosis not present

## 2021-10-12 DIAGNOSIS — D1721 Benign lipomatous neoplasm of skin and subcutaneous tissue of right arm: Secondary | ICD-10-CM | POA: Diagnosis not present

## 2022-01-17 DIAGNOSIS — Z124 Encounter for screening for malignant neoplasm of cervix: Secondary | ICD-10-CM | POA: Diagnosis not present

## 2022-01-17 DIAGNOSIS — Z6829 Body mass index (BMI) 29.0-29.9, adult: Secondary | ICD-10-CM | POA: Diagnosis not present

## 2022-01-17 DIAGNOSIS — Z01419 Encounter for gynecological examination (general) (routine) without abnormal findings: Secondary | ICD-10-CM | POA: Diagnosis not present

## 2022-04-12 DIAGNOSIS — D1721 Benign lipomatous neoplasm of skin and subcutaneous tissue of right arm: Secondary | ICD-10-CM | POA: Diagnosis not present

## 2022-04-12 DIAGNOSIS — L738 Other specified follicular disorders: Secondary | ICD-10-CM | POA: Diagnosis not present

## 2022-04-12 DIAGNOSIS — D1722 Benign lipomatous neoplasm of skin and subcutaneous tissue of left arm: Secondary | ICD-10-CM | POA: Diagnosis not present

## 2022-04-12 DIAGNOSIS — L812 Freckles: Secondary | ICD-10-CM | POA: Diagnosis not present

## 2022-04-12 DIAGNOSIS — L570 Actinic keratosis: Secondary | ICD-10-CM | POA: Diagnosis not present

## 2022-04-12 DIAGNOSIS — L821 Other seborrheic keratosis: Secondary | ICD-10-CM | POA: Diagnosis not present

## 2022-04-12 DIAGNOSIS — Z85828 Personal history of other malignant neoplasm of skin: Secondary | ICD-10-CM | POA: Diagnosis not present

## 2022-04-12 DIAGNOSIS — D1801 Hemangioma of skin and subcutaneous tissue: Secondary | ICD-10-CM | POA: Diagnosis not present

## 2022-04-19 DIAGNOSIS — Z1231 Encounter for screening mammogram for malignant neoplasm of breast: Secondary | ICD-10-CM | POA: Diagnosis not present

## 2022-06-01 DIAGNOSIS — Z0000 Encounter for general adult medical examination without abnormal findings: Secondary | ICD-10-CM | POA: Diagnosis not present

## 2022-06-01 DIAGNOSIS — Z1321 Encounter for screening for nutritional disorder: Secondary | ICD-10-CM | POA: Diagnosis not present

## 2022-06-01 DIAGNOSIS — Z6828 Body mass index (BMI) 28.0-28.9, adult: Secondary | ICD-10-CM | POA: Diagnosis not present

## 2022-06-01 DIAGNOSIS — E538 Deficiency of other specified B group vitamins: Secondary | ICD-10-CM | POA: Diagnosis not present

## 2022-06-01 DIAGNOSIS — E7800 Pure hypercholesterolemia, unspecified: Secondary | ICD-10-CM | POA: Diagnosis not present

## 2022-06-01 DIAGNOSIS — E2839 Other primary ovarian failure: Secondary | ICD-10-CM | POA: Diagnosis not present

## 2022-06-13 DIAGNOSIS — H5203 Hypermetropia, bilateral: Secondary | ICD-10-CM | POA: Diagnosis not present

## 2022-06-13 DIAGNOSIS — H52223 Regular astigmatism, bilateral: Secondary | ICD-10-CM | POA: Diagnosis not present

## 2022-06-13 DIAGNOSIS — H2513 Age-related nuclear cataract, bilateral: Secondary | ICD-10-CM | POA: Diagnosis not present

## 2022-07-19 IMAGING — MG MM DIGITAL DIAGNOSTIC UNILAT*R* W/ TOMO W/ CAD
4 series · 4 of 12 positions shown · non-contrast
Comparison: Previous exam(s).

CLINICAL DATA: 69-year-old female presenting for six-month
follow-up of the right breast after a benign, concordant
stereotactic biopsy.

EXAM:
DIGITAL DIAGNOSTIC UNILATERAL RIGHT MAMMOGRAM WITH TOMO AND CAD
TECHNIQUE: Right digital diagnostic mammography and breast tomosynthesis was
performed. Digital images of the breasts were evaluated with
computer-aided detection.

[R MLO synth-2D]
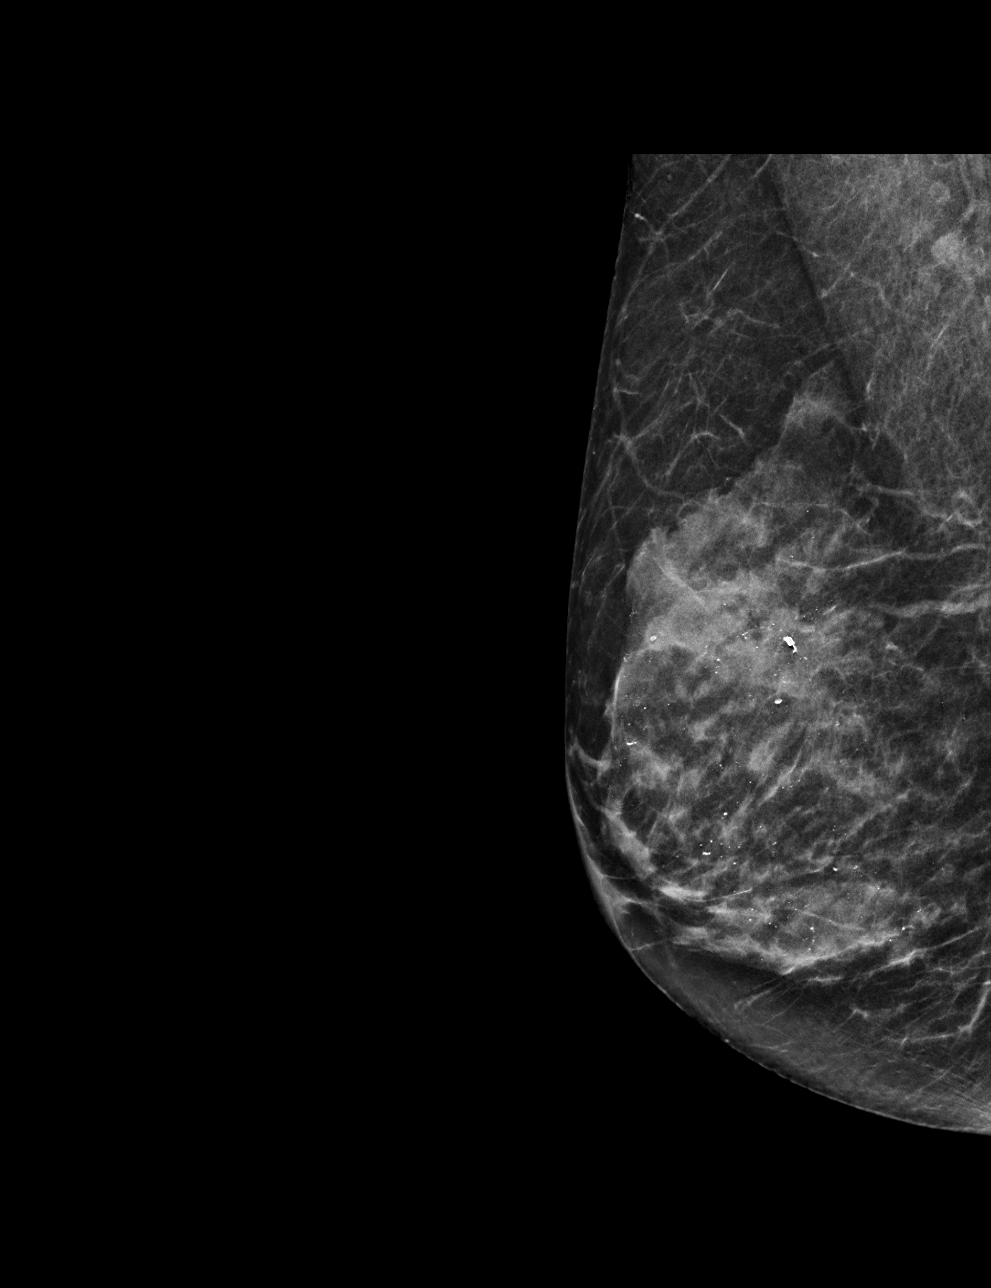

[R CC synth-2D]
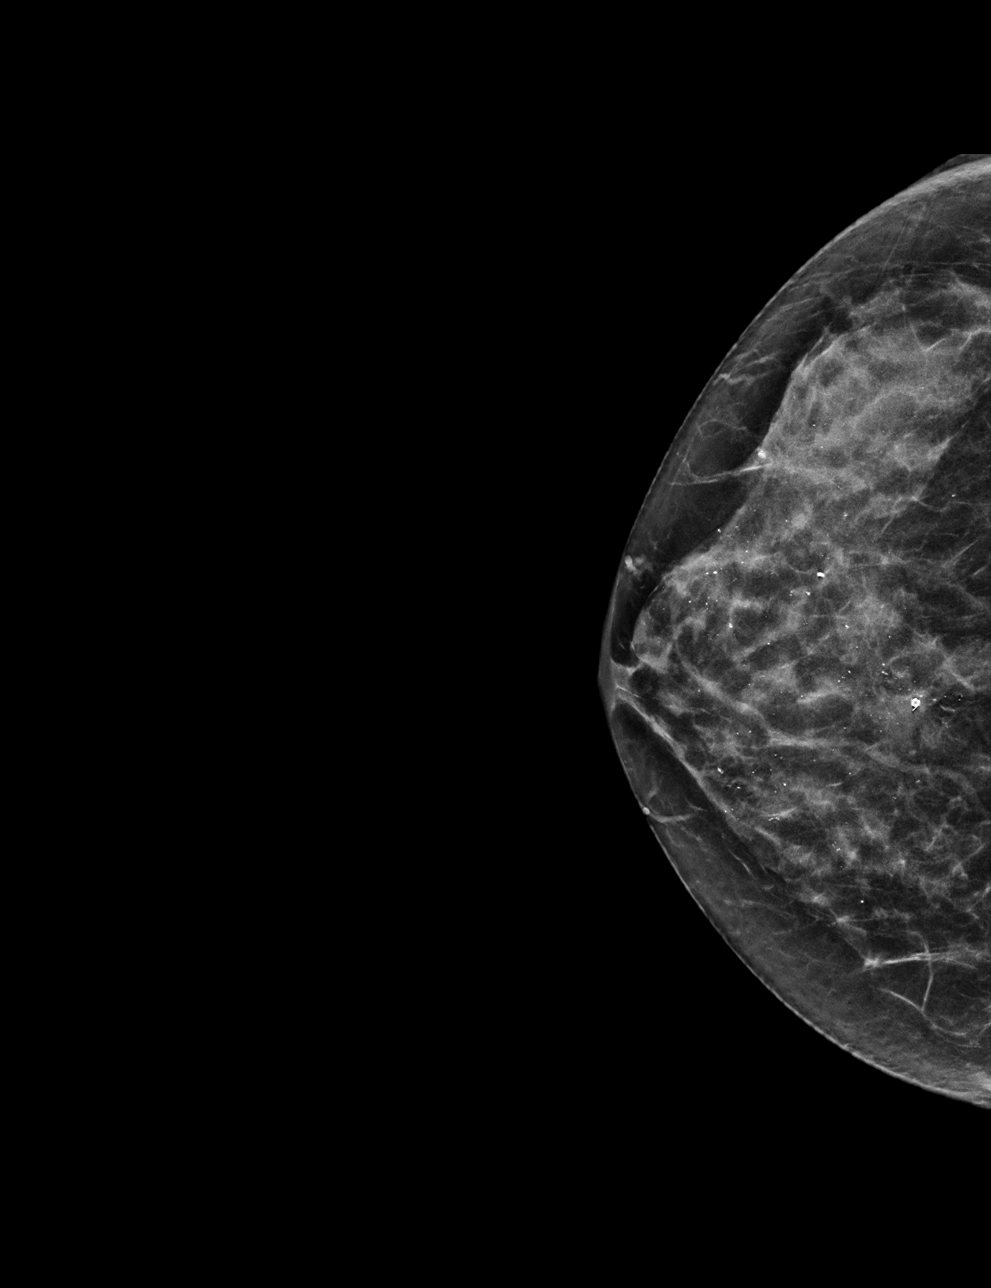

[R MLO tomo · tomo slice 24/47.0]
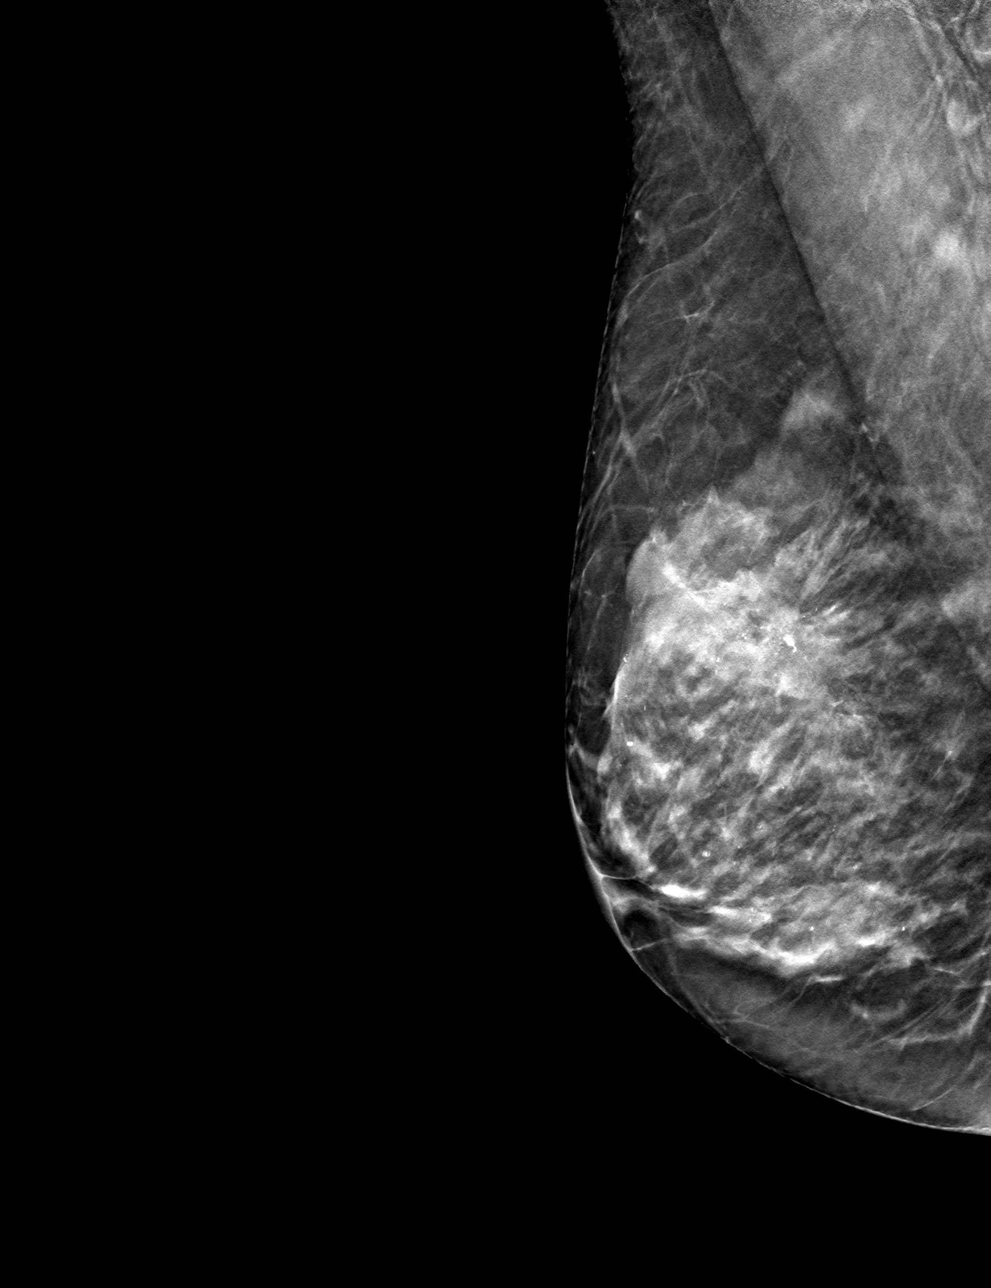

[R CC tomo · tomo slice 25/49.0]
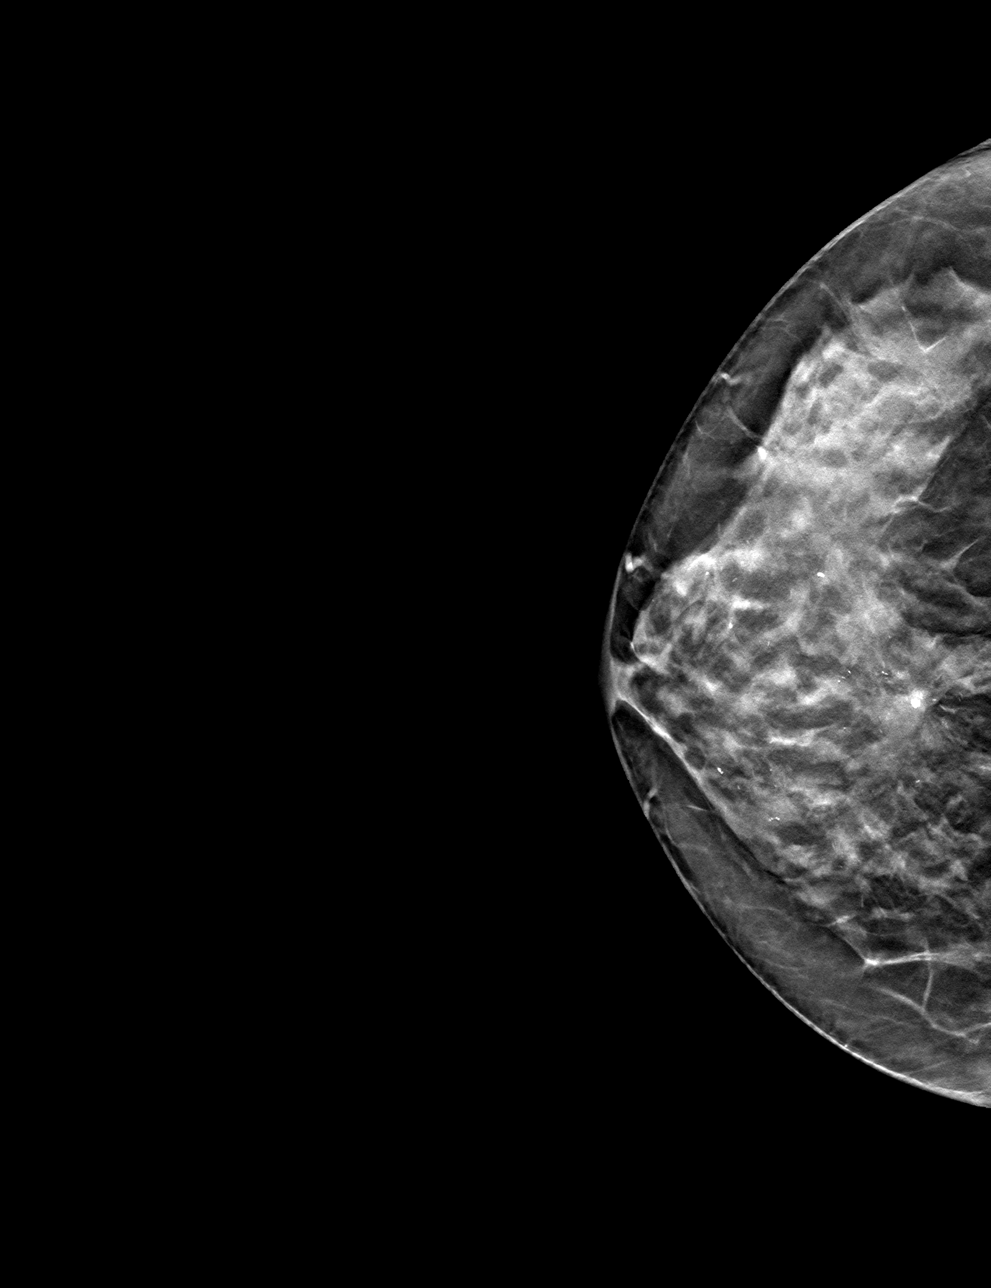

[4 of 12 positions shown; findings below may reference images not displayed]

ACR Breast Density Category c: The breast tissue is heterogeneously
dense, which may obscure small masses.
FINDINGS: A coil shaped clip and associated post biopsy changes are noted in
the upper inner right breast at posterior depth. No new or
suspicious findings in the remainder of the right breast.
IMPRESSION: Stable right breast post biopsy changes without mammographic
evidence of malignancy. No further follow-up required.

RECOMMENDATION:
The patient may resume routine annual screening, due in December 2020.

I have discussed the findings and recommendations with the patient.
If applicable, a reminder letter will be sent to the patient
regarding the next appointment.

BI-RADS CATEGORY  2: Benign.

## 2022-10-11 DIAGNOSIS — L812 Freckles: Secondary | ICD-10-CM | POA: Diagnosis not present

## 2022-10-11 DIAGNOSIS — L918 Other hypertrophic disorders of the skin: Secondary | ICD-10-CM | POA: Diagnosis not present

## 2022-10-11 DIAGNOSIS — Z85828 Personal history of other malignant neoplasm of skin: Secondary | ICD-10-CM | POA: Diagnosis not present

## 2022-10-11 DIAGNOSIS — D1721 Benign lipomatous neoplasm of skin and subcutaneous tissue of right arm: Secondary | ICD-10-CM | POA: Diagnosis not present

## 2022-10-11 DIAGNOSIS — L304 Erythema intertrigo: Secondary | ICD-10-CM | POA: Diagnosis not present

## 2022-10-11 DIAGNOSIS — D1801 Hemangioma of skin and subcutaneous tissue: Secondary | ICD-10-CM | POA: Diagnosis not present

## 2022-10-11 DIAGNOSIS — L821 Other seborrheic keratosis: Secondary | ICD-10-CM | POA: Diagnosis not present

## 2022-11-30 DIAGNOSIS — M542 Cervicalgia: Secondary | ICD-10-CM | POA: Diagnosis not present

## 2022-11-30 DIAGNOSIS — M25511 Pain in right shoulder: Secondary | ICD-10-CM | POA: Diagnosis not present

## 2023-05-03 DIAGNOSIS — Z01419 Encounter for gynecological examination (general) (routine) without abnormal findings: Secondary | ICD-10-CM | POA: Diagnosis not present

## 2023-05-03 DIAGNOSIS — Z1231 Encounter for screening mammogram for malignant neoplasm of breast: Secondary | ICD-10-CM | POA: Diagnosis not present

## 2023-06-12 DIAGNOSIS — R7989 Other specified abnormal findings of blood chemistry: Secondary | ICD-10-CM | POA: Diagnosis not present

## 2023-06-12 DIAGNOSIS — Z0000 Encounter for general adult medical examination without abnormal findings: Secondary | ICD-10-CM | POA: Diagnosis not present

## 2023-06-12 DIAGNOSIS — E559 Vitamin D deficiency, unspecified: Secondary | ICD-10-CM | POA: Diagnosis not present

## 2023-06-12 DIAGNOSIS — E7800 Pure hypercholesterolemia, unspecified: Secondary | ICD-10-CM | POA: Diagnosis not present

## 2023-06-19 DIAGNOSIS — H5203 Hypermetropia, bilateral: Secondary | ICD-10-CM | POA: Diagnosis not present

## 2023-06-19 DIAGNOSIS — H524 Presbyopia: Secondary | ICD-10-CM | POA: Diagnosis not present

## 2023-06-19 DIAGNOSIS — H52223 Regular astigmatism, bilateral: Secondary | ICD-10-CM | POA: Diagnosis not present

## 2023-11-20 DIAGNOSIS — L821 Other seborrheic keratosis: Secondary | ICD-10-CM | POA: Diagnosis not present

## 2023-11-20 DIAGNOSIS — L820 Inflamed seborrheic keratosis: Secondary | ICD-10-CM | POA: Diagnosis not present

## 2023-11-20 DIAGNOSIS — L570 Actinic keratosis: Secondary | ICD-10-CM | POA: Diagnosis not present

## 2023-11-20 DIAGNOSIS — L738 Other specified follicular disorders: Secondary | ICD-10-CM | POA: Diagnosis not present

## 2023-11-20 DIAGNOSIS — D1801 Hemangioma of skin and subcutaneous tissue: Secondary | ICD-10-CM | POA: Diagnosis not present

## 2023-11-20 DIAGNOSIS — L814 Other melanin hyperpigmentation: Secondary | ICD-10-CM | POA: Diagnosis not present

## 2023-11-20 DIAGNOSIS — Z85828 Personal history of other malignant neoplasm of skin: Secondary | ICD-10-CM | POA: Diagnosis not present

## 2023-12-04 DIAGNOSIS — H43812 Vitreous degeneration, left eye: Secondary | ICD-10-CM | POA: Diagnosis not present

## 2023-12-04 DIAGNOSIS — H2513 Age-related nuclear cataract, bilateral: Secondary | ICD-10-CM | POA: Diagnosis not present

## 2023-12-04 DIAGNOSIS — H524 Presbyopia: Secondary | ICD-10-CM | POA: Diagnosis not present

## 2023-12-04 DIAGNOSIS — H04123 Dry eye syndrome of bilateral lacrimal glands: Secondary | ICD-10-CM | POA: Diagnosis not present

## 2023-12-04 DIAGNOSIS — R059 Cough, unspecified: Secondary | ICD-10-CM | POA: Diagnosis not present

## 2023-12-04 DIAGNOSIS — H353121 Nonexudative age-related macular degeneration, left eye, early dry stage: Secondary | ICD-10-CM | POA: Diagnosis not present

## 2024-01-06 DIAGNOSIS — E7800 Pure hypercholesterolemia, unspecified: Secondary | ICD-10-CM | POA: Diagnosis not present

## 2024-02-05 DIAGNOSIS — E7800 Pure hypercholesterolemia, unspecified: Secondary | ICD-10-CM | POA: Diagnosis not present

## 2024-03-07 DIAGNOSIS — E7800 Pure hypercholesterolemia, unspecified: Secondary | ICD-10-CM | POA: Diagnosis not present

## 2024-04-07 DIAGNOSIS — E7800 Pure hypercholesterolemia, unspecified: Secondary | ICD-10-CM | POA: Diagnosis not present

## 2024-05-07 DIAGNOSIS — E7800 Pure hypercholesterolemia, unspecified: Secondary | ICD-10-CM | POA: Diagnosis not present

## 2024-05-14 DIAGNOSIS — Z1231 Encounter for screening mammogram for malignant neoplasm of breast: Secondary | ICD-10-CM | POA: Diagnosis not present

## 2024-05-17 ENCOUNTER — Other Ambulatory Visit: Payer: Self-pay | Admitting: Obstetrics & Gynecology

## 2024-05-17 DIAGNOSIS — R928 Other abnormal and inconclusive findings on diagnostic imaging of breast: Secondary | ICD-10-CM

## 2024-05-27 ENCOUNTER — Other Ambulatory Visit: Payer: Self-pay | Admitting: Obstetrics & Gynecology

## 2024-05-27 ENCOUNTER — Ambulatory Visit
Admission: RE | Admit: 2024-05-27 | Discharge: 2024-05-27 | Disposition: A | Source: Ambulatory Visit | Attending: Obstetrics & Gynecology | Admitting: Obstetrics & Gynecology

## 2024-05-27 ENCOUNTER — Ambulatory Visit
Admission: RE | Admit: 2024-05-27 | Discharge: 2024-05-27 | Disposition: A | Discharge status: Home / Self Care | Source: Ambulatory Visit | Attending: Obstetrics & Gynecology | Admitting: Obstetrics & Gynecology

## 2024-05-27 DIAGNOSIS — R928 Other abnormal and inconclusive findings on diagnostic imaging of breast: Secondary | ICD-10-CM

## 2024-05-27 DIAGNOSIS — R920 Mammographic microcalcification found on diagnostic imaging of breast: Secondary | ICD-10-CM | POA: Diagnosis not present

## 2024-05-27 DIAGNOSIS — N6312 Unspecified lump in the right breast, upper inner quadrant: Secondary | ICD-10-CM | POA: Diagnosis not present

## 2024-05-27 DIAGNOSIS — N6311 Unspecified lump in the right breast, upper outer quadrant: Secondary | ICD-10-CM

## 2024-06-05 DIAGNOSIS — H524 Presbyopia: Secondary | ICD-10-CM | POA: Diagnosis not present

## 2024-06-05 DIAGNOSIS — H52223 Regular astigmatism, bilateral: Secondary | ICD-10-CM | POA: Diagnosis not present

## 2024-06-05 DIAGNOSIS — H04123 Dry eye syndrome of bilateral lacrimal glands: Secondary | ICD-10-CM | POA: Diagnosis not present

## 2024-06-05 DIAGNOSIS — H353121 Nonexudative age-related macular degeneration, left eye, early dry stage: Secondary | ICD-10-CM | POA: Diagnosis not present

## 2024-06-05 DIAGNOSIS — H2513 Age-related nuclear cataract, bilateral: Secondary | ICD-10-CM | POA: Diagnosis not present

## 2024-06-07 DIAGNOSIS — E7800 Pure hypercholesterolemia, unspecified: Secondary | ICD-10-CM | POA: Diagnosis not present

## 2024-06-18 ENCOUNTER — Ambulatory Visit
Admission: RE | Admit: 2024-06-18 | Discharge: 2024-06-18 | Disposition: A | Discharge status: Home / Self Care | Source: Ambulatory Visit | Attending: Obstetrics & Gynecology | Admitting: Obstetrics & Gynecology

## 2024-06-18 ENCOUNTER — Other Ambulatory Visit: Payer: Self-pay | Admitting: Obstetrics & Gynecology

## 2024-06-18 DIAGNOSIS — N6311 Unspecified lump in the right breast, upper outer quadrant: Secondary | ICD-10-CM

## 2024-06-18 DIAGNOSIS — N6031 Fibrosclerosis of right breast: Secondary | ICD-10-CM | POA: Diagnosis not present

## 2024-06-18 DIAGNOSIS — N6312 Unspecified lump in the right breast, upper inner quadrant: Secondary | ICD-10-CM

## 2024-06-18 DIAGNOSIS — N6011 Diffuse cystic mastopathy of right breast: Secondary | ICD-10-CM | POA: Diagnosis not present

## 2024-06-18 HISTORY — PX: BREAST BIOPSY: SHX20

## 2024-06-19 LAB — SURGICAL PATHOLOGY

## 2024-06-25 DIAGNOSIS — E7800 Pure hypercholesterolemia, unspecified: Secondary | ICD-10-CM | POA: Diagnosis not present

## 2024-06-25 DIAGNOSIS — E2839 Other primary ovarian failure: Secondary | ICD-10-CM | POA: Diagnosis not present

## 2024-06-25 DIAGNOSIS — Z0000 Encounter for general adult medical examination without abnormal findings: Secondary | ICD-10-CM | POA: Diagnosis not present

## 2024-06-25 DIAGNOSIS — Z1331 Encounter for screening for depression: Secondary | ICD-10-CM | POA: Diagnosis not present

## 2024-06-25 DIAGNOSIS — E559 Vitamin D deficiency, unspecified: Secondary | ICD-10-CM | POA: Diagnosis not present

## 2024-06-25 DIAGNOSIS — Z6830 Body mass index (BMI) 30.0-30.9, adult: Secondary | ICD-10-CM | POA: Diagnosis not present

## 2024-07-07 DIAGNOSIS — E7800 Pure hypercholesterolemia, unspecified: Secondary | ICD-10-CM | POA: Diagnosis not present

## 2024-07-10 DIAGNOSIS — E559 Vitamin D deficiency, unspecified: Secondary | ICD-10-CM | POA: Diagnosis not present

## 2024-07-10 DIAGNOSIS — E7800 Pure hypercholesterolemia, unspecified: Secondary | ICD-10-CM | POA: Diagnosis not present

## 2024-07-10 DIAGNOSIS — E538 Deficiency of other specified B group vitamins: Secondary | ICD-10-CM | POA: Diagnosis not present
